# Patient Record
Sex: Male | Born: 1995 | Race: White | Hispanic: No | Marital: Single | Smoking: Never smoker
Health system: Southern US, Community
[De-identification: ages and names within clinical notes are randomized; demographics above are authoritative.]

## PROBLEM LIST (undated history)

## (undated) DIAGNOSIS — Z9109 Other allergy status, other than to drugs and biological substances: Secondary | ICD-10-CM

## (undated) HISTORY — PX: APPENDECTOMY: SHX54

---
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
## 2017-10-17 DIAGNOSIS — L659 Nonscarring hair loss, unspecified: Secondary | ICD-10-CM | POA: Diagnosis not present

## 2017-10-17 DIAGNOSIS — L309 Dermatitis, unspecified: Secondary | ICD-10-CM | POA: Diagnosis not present

## 2018-06-04 DIAGNOSIS — Z23 Encounter for immunization: Secondary | ICD-10-CM | POA: Diagnosis not present

## 2018-06-10 DIAGNOSIS — Z1389 Encounter for screening for other disorder: Secondary | ICD-10-CM | POA: Diagnosis not present

## 2018-06-10 DIAGNOSIS — E6609 Other obesity due to excess calories: Secondary | ICD-10-CM | POA: Diagnosis not present

## 2018-06-10 DIAGNOSIS — Z6831 Body mass index (BMI) 31.0-31.9, adult: Secondary | ICD-10-CM | POA: Diagnosis not present

## 2018-06-10 DIAGNOSIS — M26629 Arthralgia of temporomandibular joint, unspecified side: Secondary | ICD-10-CM | POA: Diagnosis not present

## 2018-07-16 DIAGNOSIS — L729 Follicular cyst of the skin and subcutaneous tissue, unspecified: Secondary | ICD-10-CM | POA: Diagnosis not present

## 2018-07-16 DIAGNOSIS — E6609 Other obesity due to excess calories: Secondary | ICD-10-CM | POA: Diagnosis not present

## 2018-07-16 DIAGNOSIS — Z6831 Body mass index (BMI) 31.0-31.9, adult: Secondary | ICD-10-CM | POA: Diagnosis not present

## 2018-12-27 DIAGNOSIS — Z23 Encounter for immunization: Secondary | ICD-10-CM | POA: Diagnosis not present

## 2019-01-03 DIAGNOSIS — R002 Palpitations: Secondary | ICD-10-CM | POA: Diagnosis not present

## 2019-01-03 DIAGNOSIS — E6609 Other obesity due to excess calories: Secondary | ICD-10-CM | POA: Diagnosis not present

## 2019-01-03 DIAGNOSIS — Z6831 Body mass index (BMI) 31.0-31.9, adult: Secondary | ICD-10-CM | POA: Diagnosis not present

## 2019-01-09 ENCOUNTER — Telehealth: Payer: Self-pay | Admitting: Cardiology

## 2019-01-09 NOTE — Telephone Encounter (Signed)
Called patient, but, phone was busy. °

## 2019-01-24 ENCOUNTER — Encounter: Payer: Self-pay | Admitting: *Deleted

## 2019-03-05 DIAGNOSIS — L649 Androgenic alopecia, unspecified: Secondary | ICD-10-CM | POA: Diagnosis not present

## 2019-03-05 DIAGNOSIS — Z6831 Body mass index (BMI) 31.0-31.9, adult: Secondary | ICD-10-CM | POA: Diagnosis not present

## 2019-03-05 DIAGNOSIS — E6609 Other obesity due to excess calories: Secondary | ICD-10-CM | POA: Diagnosis not present

## 2019-07-03 DIAGNOSIS — Z23 Encounter for immunization: Secondary | ICD-10-CM | POA: Diagnosis not present

## 2019-07-25 DIAGNOSIS — Z23 Encounter for immunization: Secondary | ICD-10-CM | POA: Diagnosis not present

## 2020-06-15 DIAGNOSIS — Z0001 Encounter for general adult medical examination with abnormal findings: Secondary | ICD-10-CM | POA: Diagnosis not present

## 2020-06-15 DIAGNOSIS — R350 Frequency of micturition: Secondary | ICD-10-CM | POA: Diagnosis not present

## 2020-06-15 DIAGNOSIS — Z23 Encounter for immunization: Secondary | ICD-10-CM | POA: Diagnosis not present

## 2020-06-15 DIAGNOSIS — Z6830 Body mass index (BMI) 30.0-30.9, adult: Secondary | ICD-10-CM | POA: Diagnosis not present

## 2020-06-15 DIAGNOSIS — R195 Other fecal abnormalities: Secondary | ICD-10-CM | POA: Diagnosis not present

## 2020-06-15 DIAGNOSIS — Z1331 Encounter for screening for depression: Secondary | ICD-10-CM | POA: Diagnosis not present

## 2020-07-01 DIAGNOSIS — K5900 Constipation, unspecified: Secondary | ICD-10-CM | POA: Diagnosis not present

## 2020-07-01 DIAGNOSIS — L0231 Cutaneous abscess of buttock: Secondary | ICD-10-CM | POA: Diagnosis not present

## 2020-07-01 DIAGNOSIS — R32 Unspecified urinary incontinence: Secondary | ICD-10-CM | POA: Diagnosis not present

## 2020-07-01 DIAGNOSIS — K649 Unspecified hemorrhoids: Secondary | ICD-10-CM | POA: Diagnosis not present

## 2020-07-28 DIAGNOSIS — J069 Acute upper respiratory infection, unspecified: Secondary | ICD-10-CM | POA: Diagnosis not present

## 2020-07-28 DIAGNOSIS — Z681 Body mass index (BMI) 19 or less, adult: Secondary | ICD-10-CM | POA: Diagnosis not present

## 2020-07-28 DIAGNOSIS — J209 Acute bronchitis, unspecified: Secondary | ICD-10-CM | POA: Diagnosis not present

## 2020-08-31 ENCOUNTER — Ambulatory Visit: Payer: 59 | Admitting: Urology

## 2020-09-03 ENCOUNTER — Ambulatory Visit: Payer: 59 | Admitting: Urology

## 2022-10-30 DIAGNOSIS — Z0000 Encounter for general adult medical examination without abnormal findings: Secondary | ICD-10-CM | POA: Diagnosis not present

## 2022-10-30 DIAGNOSIS — E663 Overweight: Secondary | ICD-10-CM | POA: Diagnosis not present

## 2022-10-30 DIAGNOSIS — Z1331 Encounter for screening for depression: Secondary | ICD-10-CM | POA: Diagnosis not present

## 2022-10-30 DIAGNOSIS — E6609 Other obesity due to excess calories: Secondary | ICD-10-CM | POA: Diagnosis not present

## 2022-10-30 DIAGNOSIS — Z6827 Body mass index (BMI) 27.0-27.9, adult: Secondary | ICD-10-CM | POA: Diagnosis not present

## 2024-02-20 ENCOUNTER — Encounter: Payer: Self-pay | Admitting: Urology

## 2024-02-20 ENCOUNTER — Ambulatory Visit: Admitting: Urology

## 2024-02-20 VITALS — BP 144/78 | HR 76

## 2024-02-20 DIAGNOSIS — N529 Male erectile dysfunction, unspecified: Secondary | ICD-10-CM | POA: Diagnosis not present

## 2024-02-20 DIAGNOSIS — R350 Frequency of micturition: Secondary | ICD-10-CM | POA: Diagnosis not present

## 2024-02-20 LAB — URINALYSIS, ROUTINE W REFLEX MICROSCOPIC
Bilirubin, UA: NEGATIVE
Glucose, UA: NEGATIVE
Ketones, UA: NEGATIVE
Leukocytes,UA: NEGATIVE
Nitrite, UA: NEGATIVE
Protein,UA: NEGATIVE
RBC, UA: NEGATIVE
Specific Gravity, UA: 1.015 (ref 1.005–1.030)
Urobilinogen, Ur: 4 mg/dL — ABNORMAL HIGH (ref 0.2–1.0)
pH, UA: 7 (ref 5.0–7.5)

## 2024-02-20 LAB — BLADDER SCAN AMB NON-IMAGING: Scan Result: 0

## 2024-02-20 MED ORDER — TADALAFIL 5 MG PO TABS: 5.0000 mg | ORAL_TABLET | Freq: Every day | ORAL | 3 refills | Status: AC

## 2024-02-20 NOTE — Progress Notes (Signed)
   Patient can void prior to the bladder scan. Bladder scan result: 0  Performed By: [facility] LPN

## 2024-02-20 NOTE — Progress Notes (Signed)
   02/20/2024 1:38 PM   Gregory Hudson 1996/03/12 [id number]  Referring provider: Marvine Rush, MD [address] [place],  [place] [address]  Urinary frequency   HPI: Mr Gregory Hudson is a 28yo here for evaluation of urinary frequency. For the past 2 years he has noted increased urinary frequency and a need to double void to empty his bladder. Urine stream is strong. IPSS 7 QOL 3. He denies any urinary hesitancy. He has nocturia 1x. No dysuria or hematuria. He has intermittent constipation. He has hemorrhoids.  He has issues maintaining an erection. He can get a firm erection.    PMH: Past Medical History:  Diagnosis Date   Environmental allergies     Surgical History: Past Surgical History:  Procedure Laterality Date   APPENDECTOMY      Home Medications:  Allergies as of 02/20/2024   No Known Allergies      Medication List    as of February 20, 2024  1:38 PM   You have not been prescribed any medications.     Allergies: No Known Allergies  Family History: No family history on file.  Social History:  reports that he has never smoked. He does not have any smokeless tobacco history on file. He reports that he does not drink alcohol and does not use drugs.  ROS: All other review of systems were reviewed and are negative except what is noted above in HPI  Physical Exam: BP (!) 144/78   Pulse 76   Constitutional:  Alert and oriented, No acute distress. HEENT: Gregory Hudson AT, moist mucus membranes.  Trachea midline, no masses. Cardiovascular: No clubbing, cyanosis, or edema. Respiratory: Normal respiratory effort, no increased work of breathing. GI: Abdomen is soft, nontender, nondistended, no abdominal masses GU: No CVA tenderness.  Lymph: No cervical or inguinal lymphadenopathy. Skin: No rashes, bruises or suspicious lesions. Neurologic: Grossly intact, no focal deficits, moving all 4 extremities. Psychiatric: Normal mood and affect.  Laboratory Data: No results found  for: WBC, HGB, HCT, MCV, PLT  No results found for: CREATININE  No results found for: PSA  No results found for: TESTOSTERONE  No results found for: HGBA1C  Urinalysis No results found for: COLORURINE, APPEARANCEUR, LABSPEC, PHURINE, GLUCOSEU, HGBUR, BILIRUBINUR, KETONESUR, PROTEINUR, UROBILINOGEN, NITRITE, LEUKOCYTESUR  No results found for: LABMICR, WBCUA, RBCUA, LABEPIT, MUCUS, BACTERIA  Pertinent Imaging:  No results found for this or any previous visit.  No results found for this or any previous visit.  No results found for this or any previous visit.  No results found for this or any previous visit.  No results found for this or any previous visit.  No results found for this or any previous visit.  No results found for this or any previous visit.  No results found for this or any previous visit.   Assessment & Plan:    1. Urinary frequency (Primary) We will trial tadalafil  5mg  daily - Urinalysis, Routine w reflex microscopic - BLADDER SCAN AMB NON-IMAGING  2. Erectile dysfunction -we will trial tadalafil  5mg  daily.    No follow-ups on file.  Gregory Clara, MD  [facility] [place]

## 2024-02-20 NOTE — Patient Instructions (Signed)

## 2024-05-21 ENCOUNTER — Other Ambulatory Visit: Admitting: Urology
